# Patient Record
Sex: Female | Born: 1960 | Race: White | Hispanic: No | Marital: Married | Smoking: Never smoker
Health system: Southern US, Community
[De-identification: ages and names within clinical notes are randomized; demographics above are authoritative.]

## PROBLEM LIST (undated history)

## (undated) HISTORY — PX: BREAST EXCISIONAL BIOPSY: SUR124

---

## 1998-08-21 ENCOUNTER — Other Ambulatory Visit: Admission: RE | Admit: 1998-08-21 | Discharge: 1998-08-21 | Payer: Self-pay | Admitting: Unknown Physician Specialty

## 2001-09-28 ENCOUNTER — Other Ambulatory Visit: Admission: RE | Admit: 2001-09-28 | Discharge: 2001-09-28 | Payer: Self-pay | Admitting: Obstetrics and Gynecology

## 2001-10-01 ENCOUNTER — Encounter: Payer: Self-pay | Admitting: Obstetrics and Gynecology

## 2001-10-01 ENCOUNTER — Encounter: Admission: RE | Admit: 2001-10-01 | Discharge: 2001-10-01 | Payer: Self-pay | Admitting: Obstetrics and Gynecology

## 2002-10-04 ENCOUNTER — Encounter: Payer: Self-pay | Admitting: Obstetrics and Gynecology

## 2002-10-04 ENCOUNTER — Encounter: Admission: RE | Admit: 2002-10-04 | Discharge: 2002-10-04 | Payer: Self-pay | Admitting: Obstetrics and Gynecology

## 2003-05-03 ENCOUNTER — Other Ambulatory Visit: Admission: RE | Admit: 2003-05-03 | Discharge: 2003-05-03 | Payer: Self-pay | Admitting: Obstetrics and Gynecology

## 2005-04-24 ENCOUNTER — Encounter: Admission: RE | Admit: 2005-04-24 | Discharge: 2005-04-24 | Payer: Self-pay | Admitting: Obstetrics and Gynecology

## 2005-10-23 ENCOUNTER — Ambulatory Visit: Payer: Self-pay | Admitting: Family Medicine

## 2006-07-01 ENCOUNTER — Encounter: Admission: RE | Admit: 2006-07-01 | Discharge: 2006-07-01 | Payer: Self-pay | Admitting: Family Medicine

## 2006-07-15 ENCOUNTER — Encounter: Admission: RE | Admit: 2006-07-15 | Discharge: 2006-07-15 | Payer: Self-pay | Admitting: Family Medicine

## 2006-08-25 ENCOUNTER — Other Ambulatory Visit: Admission: RE | Admit: 2006-08-25 | Discharge: 2006-08-25 | Payer: Self-pay | Admitting: Obstetrics and Gynecology

## 2007-07-27 ENCOUNTER — Encounter: Admission: RE | Admit: 2007-07-27 | Discharge: 2007-07-27 | Payer: Self-pay | Admitting: Family Medicine

## 2008-07-27 ENCOUNTER — Encounter: Admission: RE | Admit: 2008-07-27 | Discharge: 2008-07-27 | Payer: Self-pay | Admitting: Family Medicine

## 2008-09-06 IMAGING — MG MM SCREEN MAMMOGRAM BILATERAL
1 series · 1 of 1 positions shown · non-contrast
Comparison: none

DG SCREEN MAMMOGRAM BILATERAL
Bilateral CC and MLO view(s) were taken.
Technologist: Prajwal Traylor

DIGITAL SCREENING MAMMOGRAM WITH CAD:
The breast tissue is heterogeneously dense.  No masses or malignant type calcifications are 
identified.  Compared with prior studies.

[L MLO]
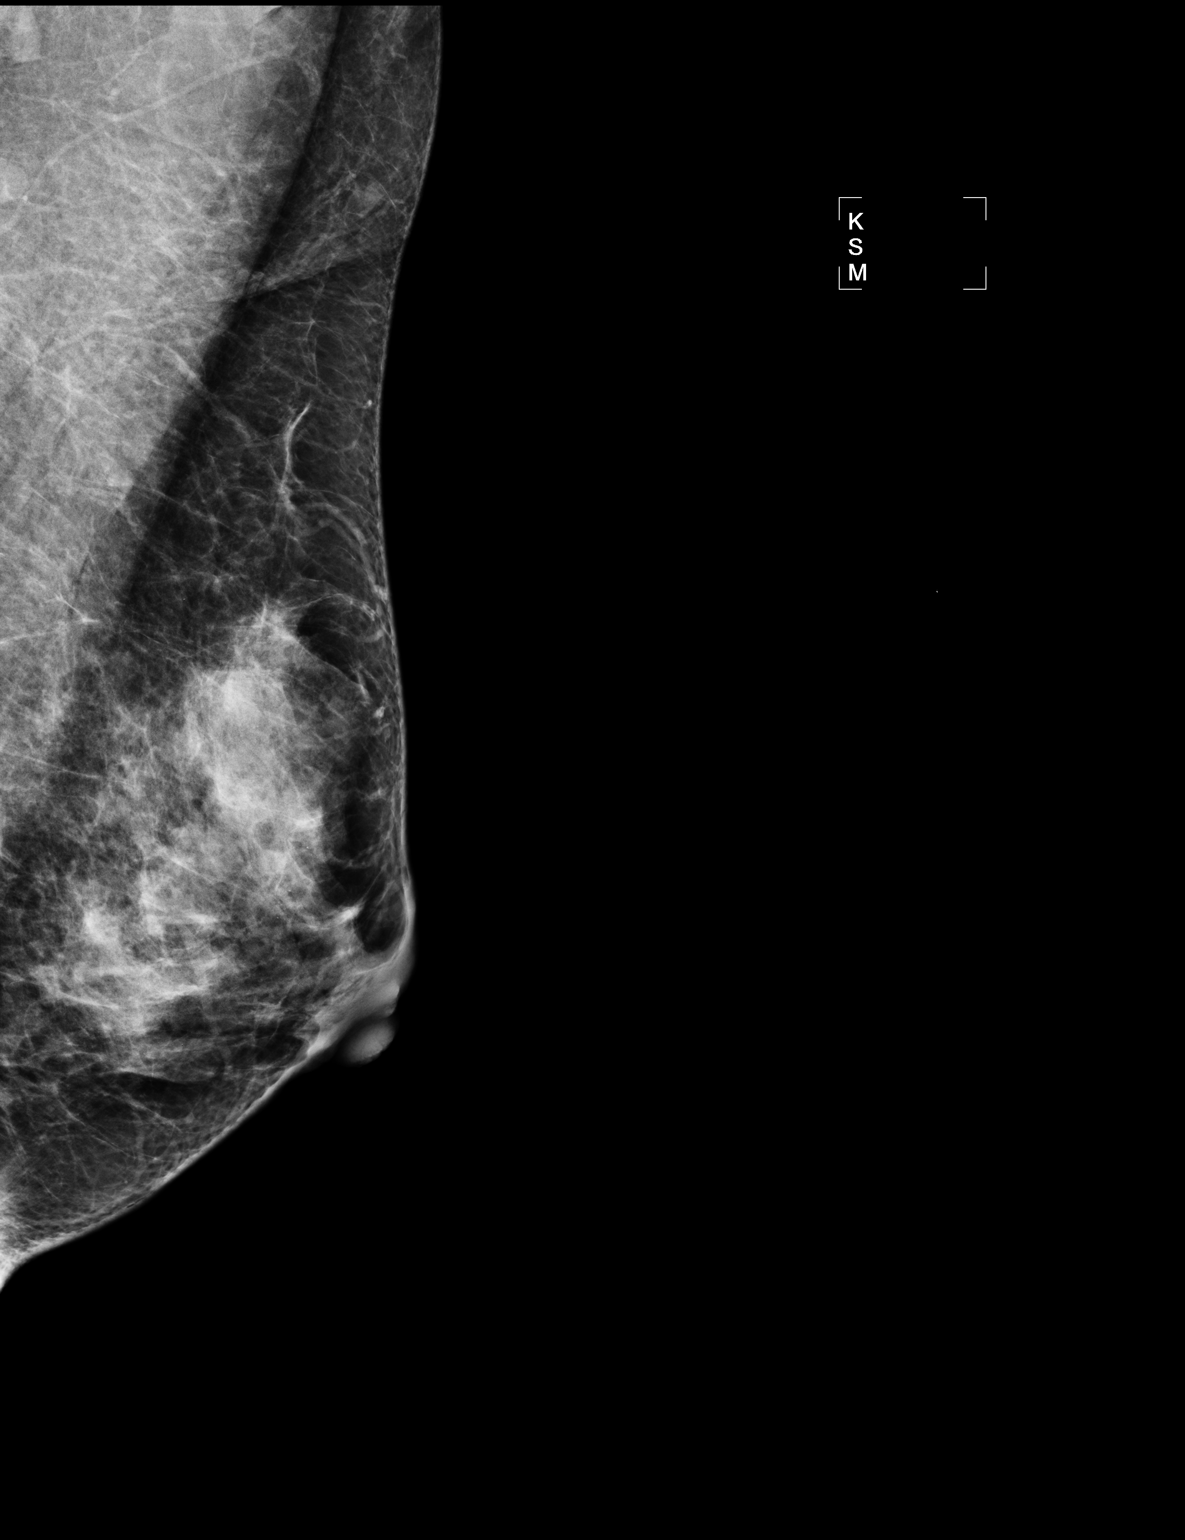

[1 of 1 positions shown; findings below may reference images not displayed]

IMPRESSION: No specific mammographic evidence of malignancy.  Next screening mammogram is recommended in one 
year.

ASSESSMENT: Negative - BI-RADS 1

Screening mammogram in 1 year.
ANALYZED BY COMPUTER AIDED DETECTION. , THIS PROCEDURE WAS A DIGITAL MAMMOGRAM.

## 2009-10-30 ENCOUNTER — Encounter: Admission: RE | Admit: 2009-10-30 | Discharge: 2009-10-30 | Payer: Self-pay | Admitting: Family Medicine

## 2010-10-31 ENCOUNTER — Encounter
Admission: RE | Admit: 2010-10-31 | Discharge: 2010-10-31 | Payer: Self-pay | Source: Home / Self Care | Attending: Family Medicine | Admitting: Family Medicine

## 2010-12-09 ENCOUNTER — Encounter: Payer: Self-pay | Admitting: Family Medicine

## 2011-10-17 ENCOUNTER — Other Ambulatory Visit: Payer: Self-pay | Admitting: Family Medicine

## 2011-10-17 DIAGNOSIS — Z1231 Encounter for screening mammogram for malignant neoplasm of breast: Secondary | ICD-10-CM

## 2011-11-14 ENCOUNTER — Ambulatory Visit: Payer: Self-pay

## 2011-11-28 ENCOUNTER — Ambulatory Visit
Admission: RE | Admit: 2011-11-28 | Discharge: 2011-11-28 | Disposition: A | Discharge status: Home / Self Care | Payer: PRIVATE HEALTH INSURANCE | Source: Ambulatory Visit | Attending: Family Medicine | Admitting: Family Medicine

## 2011-11-28 DIAGNOSIS — Z1231 Encounter for screening mammogram for malignant neoplasm of breast: Secondary | ICD-10-CM

## 2012-10-28 ENCOUNTER — Other Ambulatory Visit: Payer: Self-pay | Admitting: Family Medicine

## 2012-10-28 DIAGNOSIS — Z1231 Encounter for screening mammogram for malignant neoplasm of breast: Secondary | ICD-10-CM

## 2012-12-02 ENCOUNTER — Ambulatory Visit
Admission: RE | Admit: 2012-12-02 | Discharge: 2012-12-02 | Disposition: A | Discharge status: Home / Self Care | Payer: PRIVATE HEALTH INSURANCE | Source: Ambulatory Visit | Attending: Family Medicine | Admitting: Family Medicine

## 2012-12-02 DIAGNOSIS — Z1231 Encounter for screening mammogram for malignant neoplasm of breast: Secondary | ICD-10-CM

## 2013-01-06 DIAGNOSIS — D229 Melanocytic nevi, unspecified: Secondary | ICD-10-CM

## 2013-01-06 DIAGNOSIS — C4491 Basal cell carcinoma of skin, unspecified: Secondary | ICD-10-CM

## 2013-01-06 HISTORY — DX: Basal cell carcinoma of skin, unspecified: C44.91

## 2013-01-06 HISTORY — DX: Melanocytic nevi, unspecified: D22.9

## 2015-03-07 ENCOUNTER — Encounter: Payer: Self-pay | Admitting: *Deleted

## 2015-03-08 ENCOUNTER — Ambulatory Visit: Payer: PRIVATE HEALTH INSURANCE | Admitting: *Deleted

## 2015-03-08 ENCOUNTER — Ambulatory Visit (INDEPENDENT_AMBULATORY_CARE_PROVIDER_SITE_OTHER): Payer: PRIVATE HEALTH INSURANCE | Admitting: *Deleted

## 2015-03-08 DIAGNOSIS — I8393 Asymptomatic varicose veins of bilateral lower extremities: Secondary | ICD-10-CM

## 2015-03-08 NOTE — Progress Notes (Signed)
   Cutaneous Laser:pulsed mode  810j/cm2 400 ms delay  13 ms Duration 0.5 spot  Total pulses: 409    Total energy 646.40  Total time::05  Photos: Yes.    Compression stockings applied: No.NA  Vessels were tiny mostly, so we used the cutaneous laser. Produced appro resp. Anticipate good results. Will follow prn.

## 2016-04-16 ENCOUNTER — Encounter: Payer: Self-pay | Admitting: *Deleted

## 2016-04-24 ENCOUNTER — Ambulatory Visit (INDEPENDENT_AMBULATORY_CARE_PROVIDER_SITE_OTHER): Payer: BLUE CROSS/BLUE SHIELD | Admitting: *Deleted

## 2016-04-24 DIAGNOSIS — I8393 Asymptomatic varicose veins of bilateral lower extremities: Secondary | ICD-10-CM

## 2016-04-24 NOTE — Progress Notes (Signed)
X=.3% Sotradecol administered with a 27g butterfly.  Patient received a total of 6cc.  Treated all areas of concern with one syringe. Easy access. Tol well. Anticipate good results. Follow prn.   Compression stockings applied: Yes.

## 2017-01-08 ENCOUNTER — Encounter: Payer: Self-pay | Admitting: *Deleted

## 2017-01-15 ENCOUNTER — Ambulatory Visit (INDEPENDENT_AMBULATORY_CARE_PROVIDER_SITE_OTHER): Payer: Self-pay | Admitting: *Deleted

## 2017-01-15 DIAGNOSIS — I8393 Asymptomatic varicose veins of bilateral lower extremities: Secondary | ICD-10-CM

## 2017-01-15 NOTE — Progress Notes (Signed)
Treated her tiny red caps. Easy access. Good local rxn. Anticipate good results. Her legs look great! Follow prn.  Cutaneous Laser:pulsed mode  810j/cm2 400 ms delay  13 ms Duration 0.5 spot  Total pulses: 502 Total energy 733  Total time::06

## 2017-01-16 ENCOUNTER — Encounter: Payer: Self-pay | Admitting: *Deleted

## 2017-06-17 ENCOUNTER — Other Ambulatory Visit: Payer: Self-pay | Admitting: Obstetrics and Gynecology

## 2017-06-17 DIAGNOSIS — E2839 Other primary ovarian failure: Secondary | ICD-10-CM

## 2017-06-19 ENCOUNTER — Other Ambulatory Visit: Payer: Self-pay | Admitting: Obstetrics and Gynecology

## 2017-06-19 DIAGNOSIS — R928 Other abnormal and inconclusive findings on diagnostic imaging of breast: Secondary | ICD-10-CM

## 2017-06-27 ENCOUNTER — Ambulatory Visit
Admission: RE | Admit: 2017-06-27 | Discharge: 2017-06-27 | Disposition: A | Discharge status: Home / Self Care | Payer: BLUE CROSS/BLUE SHIELD | Source: Ambulatory Visit | Attending: Obstetrics and Gynecology | Admitting: Obstetrics and Gynecology

## 2017-06-27 ENCOUNTER — Ambulatory Visit: Payer: BLUE CROSS/BLUE SHIELD

## 2017-06-27 DIAGNOSIS — E2839 Other primary ovarian failure: Secondary | ICD-10-CM

## 2017-06-27 DIAGNOSIS — R928 Other abnormal and inconclusive findings on diagnostic imaging of breast: Secondary | ICD-10-CM

## 2020-03-13 ENCOUNTER — Encounter: Payer: Self-pay | Admitting: *Deleted

## 2020-03-14 ENCOUNTER — Ambulatory Visit: Payer: BLUE CROSS/BLUE SHIELD | Admitting: Physician Assistant

## 2021-03-29 ENCOUNTER — Ambulatory Visit: Payer: Self-pay | Admitting: Physician Assistant

## 2021-04-12 ENCOUNTER — Encounter: Payer: Self-pay | Admitting: Physician Assistant

## 2021-04-12 ENCOUNTER — Ambulatory Visit (INDEPENDENT_AMBULATORY_CARE_PROVIDER_SITE_OTHER): Payer: 59 | Admitting: Physician Assistant

## 2021-04-12 ENCOUNTER — Other Ambulatory Visit: Payer: Self-pay

## 2021-04-12 DIAGNOSIS — Z85828 Personal history of other malignant neoplasm of skin: Secondary | ICD-10-CM | POA: Diagnosis not present

## 2021-04-12 DIAGNOSIS — L814 Other melanin hyperpigmentation: Secondary | ICD-10-CM | POA: Diagnosis not present

## 2021-04-12 DIAGNOSIS — Z1283 Encounter for screening for malignant neoplasm of skin: Secondary | ICD-10-CM

## 2021-04-12 DIAGNOSIS — L821 Other seborrheic keratosis: Secondary | ICD-10-CM

## 2021-04-12 DIAGNOSIS — D485 Neoplasm of uncertain behavior of skin: Secondary | ICD-10-CM

## 2021-04-12 DIAGNOSIS — Z808 Family history of malignant neoplasm of other organs or systems: Secondary | ICD-10-CM

## 2021-04-12 DIAGNOSIS — D1800 Hemangioma unspecified site: Secondary | ICD-10-CM

## 2021-04-12 DIAGNOSIS — L578 Other skin changes due to chronic exposure to nonionizing radiation: Secondary | ICD-10-CM

## 2021-04-12 DIAGNOSIS — L570 Actinic keratosis: Secondary | ICD-10-CM

## 2021-04-12 DIAGNOSIS — Z86018 Personal history of other benign neoplasm: Secondary | ICD-10-CM | POA: Diagnosis not present

## 2021-04-12 NOTE — Patient Instructions (Signed)

## 2021-04-12 NOTE — Progress Notes (Signed)
   Follow-Up Visit   Subjective  Anne Alvarado is a 60 y.o. female who presents for the following: Annual Exam (No new concerns.Per records reviewed personal history of non mole skin cancers & multiple atypical nevi. Patient states she does have family history of melanoma.  ).   The following portions of the chart were reviewed this encounter and updated as appropriate:  Tobacco  Allergies  Meds  Problems  Med Hx  Surg Hx  Fam Hx      Objective  Well appearing patient in no apparent distress; mood and affect are within normal limits.  A full examination was performed including scalp, head, eyes, ears, nose, lips, neck, chest, axillae, abdomen, back, buttocks, bilateral upper extremities, bilateral lower extremities, hands, feet, fingers, toes, fingernails, and toenails. All findings within normal limits unless otherwise noted below.  Objective  Dorsum of Nose: Pink macule with pearly tone. Positive for blanching.      Assessment & Plan  Neoplasm of uncertain behavior of skin Dorsum of Nose  Skin / nail biopsy Type of biopsy: tangential   Informed consent: discussed and consent obtained   Timeout: patient name, date of birth, surgical site, and procedure verified   Procedure prep:  Patient was prepped and draped in usual sterile fashion (Non sterile) Prep type:  Chlorhexidine Anesthesia: the lesion was anesthetized in a standard fashion   Anesthetic:  1% lidocaine w/ epinephrine 1-100,000 local infiltration Instrument used: flexible razor blade   Outcome: patient tolerated procedure well   Post-procedure details: wound care instructions given    Specimen 1 - Surgical pathology Differential Diagnosis: bcc vs scc  Check Margins: No  Lentigines - Scattered tan macules - Discussed due to sun exposure - Benign, observe - Call for any changes  Seborrheic Keratoses - Stuck-on, waxy, tan-brown papules and plaques  - Discussed benign etiology and prognosis. -  Observe - Call for any changes  Hemangiomas - Red papules - Discussed benign nature - Observe - Call for any changes  Actinic Damage - diffuse scaly erythematous macules with underlying dyspigmentation - Recommend daily broad spectrum sunscreen SPF 30+ to sun-exposed areas, reapply every 2 hours as needed.  - Call for new or changing lesions.  Skin cancer screening performed today. No atypical nevi.    I, Taitum Alms, PA-C, have reviewed all documentation's for this visit.  The documentation on 04/12/21 for the exam, diagnosis, procedures and orders are all accurate and complete.

## 2021-04-25 ENCOUNTER — Telehealth: Payer: Self-pay | Admitting: *Deleted

## 2021-04-25 NOTE — Telephone Encounter (Signed)
-----   Message from Warren Danes, [place] sent at 04/25/2021  8:01 AM EDT ----- RTC if recur

## 2021-04-25 NOTE — Telephone Encounter (Signed)
Pathology to patient.  °

## 2021-12-01 ENCOUNTER — Other Ambulatory Visit: Payer: Self-pay | Admitting: Obstetrics and Gynecology

## 2021-12-01 DIAGNOSIS — R928 Other abnormal and inconclusive findings on diagnostic imaging of breast: Secondary | ICD-10-CM

## 2021-12-13 ENCOUNTER — Other Ambulatory Visit: Payer: Self-pay

## 2021-12-13 ENCOUNTER — Ambulatory Visit
Admission: RE | Admit: 2021-12-13 | Discharge: 2021-12-13 | Disposition: A | Discharge status: Home / Self Care | Payer: 59 | Source: Ambulatory Visit | Attending: Obstetrics and Gynecology | Admitting: Obstetrics and Gynecology

## 2021-12-13 ENCOUNTER — Ambulatory Visit: Payer: 59

## 2021-12-13 DIAGNOSIS — R928 Other abnormal and inconclusive findings on diagnostic imaging of breast: Secondary | ICD-10-CM

## 2022-01-07 ENCOUNTER — Other Ambulatory Visit: Payer: 59

## 2022-04-02 ENCOUNTER — Other Ambulatory Visit: Payer: Self-pay | Admitting: Physician Assistant

## 2022-04-02 ENCOUNTER — Ambulatory Visit (INDEPENDENT_AMBULATORY_CARE_PROVIDER_SITE_OTHER): Payer: 59 | Admitting: Physician Assistant

## 2022-04-02 ENCOUNTER — Encounter: Payer: Self-pay | Admitting: Physician Assistant

## 2022-04-02 DIAGNOSIS — D485 Neoplasm of uncertain behavior of skin: Secondary | ICD-10-CM

## 2022-04-02 DIAGNOSIS — Z85828 Personal history of other malignant neoplasm of skin: Secondary | ICD-10-CM | POA: Diagnosis not present

## 2022-04-02 DIAGNOSIS — D229 Melanocytic nevi, unspecified: Secondary | ICD-10-CM

## 2022-04-02 DIAGNOSIS — Z86018 Personal history of other benign neoplasm: Secondary | ICD-10-CM | POA: Diagnosis not present

## 2022-04-02 DIAGNOSIS — D225 Melanocytic nevi of trunk: Secondary | ICD-10-CM | POA: Diagnosis not present

## 2022-04-02 DIAGNOSIS — Z1283 Encounter for screening for malignant neoplasm of skin: Secondary | ICD-10-CM

## 2022-04-02 HISTORY — DX: Melanocytic nevi, unspecified: D22.9

## 2022-04-02 NOTE — Patient Instructions (Signed)

## 2022-04-02 NOTE — Progress Notes (Signed)
? ?  Follow-Up Visit ?  ?Subjective  ?Anne Alvarado is a 61 y.o. female who presents for the following: Annual Exam (Here for annual skin exam. No concerns. History of non mole skin cancers. History of atypical moles. ). ? ? ?The following portions of the chart were reviewed this encounter and updated as appropriate:  Tobacco  Allergies  Meds  Problems  Med Hx  Surg Hx  Fam Hx   ?  ? ?Objective  ?Well appearing patient in no apparent distress; mood and affect are within normal limits. ? ?A full examination was performed including scalp, head, eyes, ears, nose, lips, neck, chest, axillae, abdomen, back, buttocks, bilateral upper extremities, bilateral lower extremities, hands, feet, fingers, toes, fingernails, and toenails. All findings within normal limits unless otherwise noted below. ? ?No signs of non-mole skin cancer.  ? ?Right Abdomen (side) - Lower ?Bichromic dark nested macule.  ? ? ? ? ? ? ? ?Assessment & Plan  ?Screening for malignant neoplasm of skin ? ?Yearly skin exam ? ?Neoplasm of uncertain behavior of skin ?Right Abdomen (side) - Lower ? ?Skin / nail biopsy ?Type of biopsy: tangential   ?Informed consent: discussed and consent obtained   ?Timeout: patient name, date of birth, surgical site, and procedure verified   ?Anesthesia: the lesion was anesthetized in a standard fashion   ?Anesthetic:  1% lidocaine w/ epinephrine 1-100,000 local infiltration ?Instrument used: flexible razor blade   ?Hemostasis achieved with: ferric subsulfate   ?Outcome: patient tolerated procedure well   ?Post-procedure details: wound care instructions given   ? ?Specimen 1 - Surgical pathology ?Differential Diagnosis: atypia  ? ?Check Margins: YES ? ? ? ?I, Anne Bellard, PA-C, have reviewed all documentation's for this visit.  The documentation on 04/02/22 for the exam, diagnosis, procedures and orders are all accurate and complete. ?

## 2022-04-08 ENCOUNTER — Telehealth: Payer: Self-pay

## 2022-04-08 NOTE — Telephone Encounter (Signed)
-----   Message from Warren Danes, [place] sent at 04/08/2022 11:13 AM EDT ----- ws

## 2022-04-08 NOTE — Telephone Encounter (Signed)
Phone call to patient with her pathology results. Patient aware of results.  

## 2022-04-16 ENCOUNTER — Ambulatory Visit: Payer: 59 | Admitting: Physician Assistant

## 2022-05-08 ENCOUNTER — Ambulatory Visit (INDEPENDENT_AMBULATORY_CARE_PROVIDER_SITE_OTHER): Payer: 59 | Admitting: Physician Assistant

## 2022-05-08 ENCOUNTER — Encounter: Payer: Self-pay | Admitting: Physician Assistant

## 2022-05-08 DIAGNOSIS — D229 Melanocytic nevi, unspecified: Secondary | ICD-10-CM

## 2022-05-08 DIAGNOSIS — D225 Melanocytic nevi of trunk: Secondary | ICD-10-CM | POA: Diagnosis not present

## 2022-05-08 NOTE — Patient Instructions (Signed)

## 2022-05-08 NOTE — Progress Notes (Signed)
   Follow-Up Visit   Subjective  Anne Alvarado is a 61 y.o. female who presents for the following: Procedure (Wider shave right abdomen severe atypia). Healed well.    The following portions of the chart were reviewed this encounter and updated as appropriate:  Tobacco  Allergies  Meds  Problems  Med Hx  Surg Hx  Fam Hx      Objective  Well appearing patient in no apparent distress; mood and affect are within normal limits.  A focused examination was performed including abdomen. Relevant physical exam findings are noted in the Assessment and Plan.  Right Abdomen (side) - Lower Pink macule   Assessment & Plan  Atypical mole Right Abdomen (side) - Lower  Epidermal / dermal shaving - Right Abdomen (side) - Lower  Lesion diameter (cm):  1.3 Informed consent: discussed and consent obtained   Timeout: patient name, date of birth, surgical site, and procedure verified   Procedure prep:  Patient was prepped and draped in usual sterile fashion Prep type:  Chlorhexidine Anesthesia: the lesion was anesthetized in a standard fashion   Anesthetic:  1% lidocaine w/ epinephrine 1-100,000 local infiltration Instrument used: DermaBlade   Hemostasis achieved with: aluminum chloride and electrodesiccation   Outcome: patient tolerated procedure well   Post-procedure details: sterile dressing applied and wound care instructions given   Dressing type: petrolatum and bandage    Specimen 1 - Surgical pathology Differential Diagnosis: R/O Atypia - cautery after biopsy  [id number]  Check Margins: yes    I, Jacalynn Buzzell, PA-C, have reviewed all documentation's for this visit.  The documentation on 05/08/22 for the exam, diagnosis, procedures and orders are all accurate and complete.

## 2022-05-14 ENCOUNTER — Telehealth: Payer: Self-pay

## 2022-11-06 ENCOUNTER — Ambulatory Visit: Payer: 59 | Admitting: Physician Assistant

## 2022-11-06 ENCOUNTER — Ambulatory Visit (INDEPENDENT_AMBULATORY_CARE_PROVIDER_SITE_OTHER): Payer: 59 | Admitting: Family Medicine

## 2022-11-06 VITALS — BP 130/70 | HR 76 | Temp 98.2°F | Ht 65.75 in | Wt 141.0 lb

## 2022-11-06 DIAGNOSIS — Z0000 Encounter for general adult medical examination without abnormal findings: Secondary | ICD-10-CM | POA: Diagnosis not present

## 2022-11-06 DIAGNOSIS — Z13228 Encounter for screening for other metabolic disorders: Secondary | ICD-10-CM

## 2022-11-06 DIAGNOSIS — Z130 Encounter for screening for diseases of the blood and blood-forming organs and certain disorders involving the immune mechanism: Secondary | ICD-10-CM | POA: Diagnosis not present

## 2022-11-06 DIAGNOSIS — Z13220 Encounter for screening for lipoid disorders: Secondary | ICD-10-CM

## 2022-11-06 DIAGNOSIS — F419 Anxiety disorder, unspecified: Secondary | ICD-10-CM | POA: Insufficient documentation

## 2022-11-06 NOTE — Progress Notes (Signed)
Subjective:  Patient ID: Anne Alvarado, female    DOB: 09-01-1961  Age: 61 y.o. MRN: [id number]  CC: Chief Complaint  Patient presents with   Establish Care    HPI:  61 year old female presents to establish care.  Patient states that she is doing well.  Patient has not been seen by primary care provider since 2020.  She has no specific complaints or concerns at this time.  Patient declines influenza vaccine.  Mammogram up-to-date.  Patient reports that her last colonoscopy was 3 years ago.  She is due at the age of 54.  This was done via [facility].  Need records.  She is followed by OB/GYN, Dr. Marvel Plan.  She states that her Pap smear is up-to-date as well.  Need records.  Discussed getting shingles vaccine.  She will consider getting this at her pharmacy.  Unsure of last tetanus.  Need records.  Social Hx   Social History   Socioeconomic History   Marital status: Married    Spouse name: Not on file   Number of children: Not on file   Years of education: Not on file   Highest education level: Not on file  Occupational History   Not on file  Tobacco Use   Smoking status: Never   Smokeless tobacco: Never  Vaping Use   Vaping Use: Never used  Substance and Sexual Activity   Alcohol use: Yes   Drug use: Never   Sexual activity: Not on file  Other Topics Concern   Not on file  Social History Narrative   Not on file   Social Determinants of Health   Financial Resource Strain: Not on file  Food Insecurity: Not on file  Transportation Needs: Not on file  Physical Activity: Not on file  Stress: Not on file  Social Connections: Not on file    Review of Systems  Constitutional: Negative.   Respiratory: Negative.    Cardiovascular: Negative.   Gastrointestinal: Negative.    Objective:  BP 130/70   Pulse 76   Temp 98.2 F (36.8 C) (Oral)   Ht 5' 5.75" (1.67 m)   Wt 141 lb (64 kg)   SpO2 98%   BMI 22.93 kg/m      11/06/2022   10:27 AM   BP/Weight  Systolic BP 144  Diastolic BP 70  Wt. (Lbs) 141  BMI 22.93 kg/m2    Physical Exam Vitals and nursing note reviewed.  Constitutional:      General: She is not in acute distress.    Appearance: Normal appearance.  HENT:     Head: Normocephalic and atraumatic.  Eyes:     General:        Right eye: No discharge.        Left eye: No discharge.     Conjunctiva/sclera: Conjunctivae normal.  Cardiovascular:     Rate and Rhythm: Normal rate and regular rhythm.  Pulmonary:     Effort: Pulmonary effort is normal.     Breath sounds: Normal breath sounds. No wheezing, rhonchi or rales.  Neurological:     Mental Status: She is alert.  Psychiatric:        Mood and Affect: Mood normal.        Behavior: Behavior normal.    Assessment & Plan:   Problem List Items Addressed This Visit       Other   Annual physical exam - Primary    Screening labs ordered. Colonoscopy up-to-date as well as Pap  smear per patient report.  Need records. Unsure of last tetanus. Declines flu vaccine today. Recommended getting shingles vaccine at her pharmacy.      Other Visit Diagnoses     Screening for deficiency anemia       Relevant Orders   CBC   Screening for lipid disorders       Relevant Orders   Lipid panel   Screening for metabolic disorder       Relevant Orders   CMP14+EGFR      Follow-up:  Return in about 1 year (around 11/07/2023).  [place]

## 2022-11-06 NOTE — Patient Instructions (Signed)
You can do your labs whenever you like.  Follow up annually.  Take care  Dr. Lacinda Axon

## 2022-11-06 NOTE — Assessment & Plan Note (Signed)
Screening labs ordered. Colonoscopy up-to-date as well as Pap smear per patient report.  Need records. Unsure of last tetanus. Declines flu vaccine today. Recommended getting shingles vaccine at her pharmacy.

## 2023-02-04 ENCOUNTER — Other Ambulatory Visit: Payer: Self-pay | Admitting: Obstetrics and Gynecology

## 2023-02-04 DIAGNOSIS — Z0000 Encounter for general adult medical examination without abnormal findings: Secondary | ICD-10-CM

## 2023-02-06 ENCOUNTER — Ambulatory Visit
Admission: RE | Admit: 2023-02-06 | Discharge: 2023-02-06 | Disposition: A | Discharge status: Home / Self Care | Payer: 59 | Source: Ambulatory Visit | Attending: Obstetrics and Gynecology | Admitting: Obstetrics and Gynecology

## 2023-02-06 DIAGNOSIS — Z1231 Encounter for screening mammogram for malignant neoplasm of breast: Secondary | ICD-10-CM | POA: Diagnosis not present

## 2023-02-06 DIAGNOSIS — Z0000 Encounter for general adult medical examination without abnormal findings: Secondary | ICD-10-CM

## 2023-03-13 DIAGNOSIS — G4700 Insomnia, unspecified: Secondary | ICD-10-CM | POA: Diagnosis not present

## 2023-03-13 DIAGNOSIS — Z0142 Encounter for cervical smear to confirm findings of recent normal smear following initial abnormal smear: Secondary | ICD-10-CM | POA: Diagnosis not present

## 2023-03-13 DIAGNOSIS — N898 Other specified noninflammatory disorders of vagina: Secondary | ICD-10-CM | POA: Diagnosis not present

## 2023-03-13 DIAGNOSIS — Z124 Encounter for screening for malignant neoplasm of cervix: Secondary | ICD-10-CM | POA: Diagnosis not present

## 2023-03-13 DIAGNOSIS — Z01411 Encounter for gynecological examination (general) (routine) with abnormal findings: Secondary | ICD-10-CM | POA: Diagnosis not present

## 2023-03-13 DIAGNOSIS — Z1151 Encounter for screening for human papillomavirus (HPV): Secondary | ICD-10-CM | POA: Diagnosis not present

## 2023-03-13 DIAGNOSIS — Z130 Encounter for screening for diseases of the blood and blood-forming organs and certain disorders involving the immune mechanism: Secondary | ICD-10-CM | POA: Diagnosis not present

## 2023-03-13 DIAGNOSIS — Z1389 Encounter for screening for other disorder: Secondary | ICD-10-CM | POA: Diagnosis not present

## 2023-03-13 DIAGNOSIS — Z1272 Encounter for screening for malignant neoplasm of vagina: Secondary | ICD-10-CM | POA: Diagnosis not present

## 2023-04-03 ENCOUNTER — Ambulatory Visit: Payer: 59 | Admitting: Physician Assistant

## 2023-04-10 DIAGNOSIS — Z6822 Body mass index (BMI) 22.0-22.9, adult: Secondary | ICD-10-CM | POA: Diagnosis not present

## 2023-04-10 DIAGNOSIS — J020 Streptococcal pharyngitis: Secondary | ICD-10-CM | POA: Diagnosis not present

## 2023-05-08 ENCOUNTER — Encounter: Payer: Self-pay | Admitting: Family Medicine

## 2023-05-08 ENCOUNTER — Ambulatory Visit: Payer: 59 | Admitting: Family Medicine

## 2023-05-08 VITALS — BP 158/70 | HR 59 | Temp 97.9°F | Ht 65.75 in | Wt 140.0 lb

## 2023-05-08 DIAGNOSIS — Z130 Encounter for screening for diseases of the blood and blood-forming organs and certain disorders involving the immune mechanism: Secondary | ICD-10-CM

## 2023-05-08 DIAGNOSIS — Z13220 Encounter for screening for lipoid disorders: Secondary | ICD-10-CM | POA: Diagnosis not present

## 2023-05-08 DIAGNOSIS — R030 Elevated blood-pressure reading, without diagnosis of hypertension: Secondary | ICD-10-CM | POA: Diagnosis not present

## 2023-05-08 NOTE — Progress Notes (Signed)
Subjective:  Patient ID: Anne Alvarado, female    DOB: [birth year]  Age: 62 y.o. MRN: [id number]  CC: Chief Complaint  Patient presents with   6 month follow up    No concerns/Patient reports has white coat syndrome    HPI:  62 year old female presents for follow-up.  Blood pressure elevated here today.  Patient states that her blood pressure is often elevated when she is at a physician's office.  She states that her home readings are in the 120 systolic.  Pap smear and colonoscopy up-to-date.  Patient states she is feeling well.  No chest pain or shortness of breath.  No vision issues.  No hearing difficulties.  She states that she is doing well.  Patient Active Problem List   Diagnosis Date Noted   Elevated BP without diagnosis of hypertension 05/08/2023   Anxiety 11/06/2022   Annual physical exam 11/06/2022    Social Hx   Social History   Socioeconomic History   Marital status: Married    Spouse name: Not on file   Number of children: Not on file   Years of education: Not on file   Highest education level: 12th grade  Occupational History   Not on file  Tobacco Use   Smoking status: Never   Smokeless tobacco: Never  Vaping Use   Vaping Use: Never used  Substance and Sexual Activity   Alcohol use: Yes   Drug use: Never   Sexual activity: Not on file  Other Topics Concern   Not on file  Social History Narrative   Not on file   Social Determinants of Health   Financial Resource Strain: Low Risk  (05/05/2023)   Overall Financial Resource Strain (CARDIA)    Difficulty of Paying Living Expenses: Not hard at all  Food Insecurity: No Food Insecurity (05/05/2023)   Hunger Vital Sign    Worried About Running Out of Food in the Last Year: Never true    Ran Out of Food in the Last Year: Never true  Transportation Needs: No Transportation Needs (05/05/2023)   PRAPARE - Administrator, Civil Service (Medical): No    Lack of Transportation  (Non-Medical): No  Physical Activity: Insufficiently Active (05/05/2023)   Exercise Vital Sign    Days of Exercise per Week: 4 days    Minutes of Exercise per Session: 20 min  Stress: No Stress Concern Present (05/05/2023)   [employer] of Occupational Health - Occupational Stress Questionnaire    Feeling of Stress : Only a little  Social Connections: Moderately Integrated (05/05/2023)   Social Connection and Isolation Panel [NHANES]    Frequency of Communication with Friends and Family: More than three times a week    Frequency of Social Gatherings with Friends and Family: More than three times a week    Attends Religious Services: More than 4 times per year    Active Member of [employer] or Organizations: No    Attends Engineer, structural: Not on file    Marital Status: Married    Review of Systems Per HPI  Objective:  BP (!) 158/70   Pulse (!) 59   Temp 97.9 F (36.6 C)   Ht 5' 5.75" (1.67 m)   Wt 140 lb (63.5 kg)   BMI 22.77 kg/m      05/08/2023   10:17 AM 05/08/2023    9:41 AM 11/06/2022   10:27 AM  BP/Weight  Systolic BP 158 192 130  Diastolic BP 70  67 70  Wt. (Lbs)  140 141  BMI  22.77 kg/m2 22.93 kg/m2    Physical Exam Vitals and nursing note reviewed.  Constitutional:      General: She is not in acute distress.    Appearance: Normal appearance.  HENT:     Head: Normocephalic and atraumatic.  Eyes:     General:        Right eye: No discharge.        Left eye: No discharge.     Conjunctiva/sclera: Conjunctivae normal.  Cardiovascular:     Rate and Rhythm: Normal rate and regular rhythm.  Pulmonary:     Effort: Pulmonary effort is normal.     Breath sounds: Normal breath sounds. No wheezing, rhonchi or rales.  Neurological:     Mental Status: She is alert.  Psychiatric:        Mood and Affect: Mood normal.        Behavior: Behavior normal.     No results found for: "WBC", "HGB", "HCT", "PLT", "GLUCOSE", "CHOL", "TRIG", "HDL",  "LDLDIRECT", "LDLCALC", "ALT", "AST", "NA", "K", "CL", "CREATININE", "BUN", "CO2", "TSH", "PSA", "INR", "GLUF", "HGBA1C", "MICROALBUR"   Assessment & Plan:   Problem List Items Addressed This Visit       Other   Elevated BP without diagnosis of hypertension - Primary    Patient's blood pressure elevated but normal at home.  Will continue to monitor.  Patient was supposed to get labs at her last visit.  Will order labs today.  Supportive care.      Relevant Orders   CMP14+EGFR   Other Visit Diagnoses     Screening for deficiency anemia       Relevant Orders   CBC   Screening for lipid disorders       Relevant Orders   Lipid panel       Follow-up:  Return in about 1 year (around 05/07/2024).  Everlene Other DO [facility]

## 2023-05-08 NOTE — Patient Instructions (Signed)
Labs today. ° °Follow up annually. ° °Take care ° °Dr. Leylah Tarnow  °

## 2023-05-08 NOTE — Assessment & Plan Note (Signed)
Patient's blood pressure elevated but normal at home.  Will continue to monitor.  Patient was supposed to get labs at her last visit.  Will order labs today.  Supportive care.

## 2023-05-09 LAB — CBC
Hematocrit: 40 % (ref 34.0–46.6)
Hemoglobin: 13.5 g/dL (ref 11.1–15.9)
MCH: 31.2 pg (ref 26.6–33.0)
MCHC: 33.8 g/dL (ref 31.5–35.7)
MCV: 92 fL (ref 79–97)
Platelets: 225 10*3/uL (ref 150–450)
RBC: 4.33 x10E6/uL (ref 3.77–5.28)
RDW: 12.2 % (ref 11.7–15.4)
WBC: 5.5 10*3/uL (ref 3.4–10.8)

## 2023-05-09 LAB — CMP14+EGFR
ALT: 21 IU/L (ref 0–32)
AST: 22 IU/L (ref 0–40)
Albumin: 4.7 g/dL (ref 3.9–4.9)
Alkaline Phosphatase: 108 IU/L (ref 44–121)
BUN/Creatinine Ratio: 19 (ref 12–28)
BUN: 16 mg/dL (ref 8–27)
Bilirubin Total: 0.5 mg/dL (ref 0.0–1.2)
CO2: 23 mmol/L (ref 20–29)
Calcium: 10 mg/dL (ref 8.7–10.3)
Chloride: 101 mmol/L (ref 96–106)
Creatinine, Ser: 0.83 mg/dL (ref 0.57–1.00)
Globulin, Total: 2.8 g/dL (ref 1.5–4.5)
Glucose: 89 mg/dL (ref 70–99)
Potassium: 4.2 mmol/L (ref 3.5–5.2)
Sodium: 140 mmol/L (ref 134–144)
Total Protein: 7.5 g/dL (ref 6.0–8.5)
eGFR: 79 mL/min/{1.73_m2} (ref >59)

## 2023-05-09 LAB — LIPID PANEL
Chol/HDL Ratio: 2.1 ratio (ref 0.0–4.4)
Cholesterol, Total: 187 mg/dL (ref 100–199)
HDL: 88 mg/dL (ref >39)
LDL Chol Calc (NIH): 78 mg/dL (ref 0–99)
Triglycerides: 126 mg/dL (ref 0–149)
VLDL Cholesterol Cal: 21 mg/dL (ref 5–40)

## 2024-03-01 ENCOUNTER — Other Ambulatory Visit: Payer: Self-pay | Admitting: Obstetrics and Gynecology

## 2024-03-01 DIAGNOSIS — Z0000 Encounter for general adult medical examination without abnormal findings: Secondary | ICD-10-CM

## 2024-03-03 ENCOUNTER — Ambulatory Visit
Admission: RE | Admit: 2024-03-03 | Discharge: 2024-03-03 | Disposition: A | Discharge status: Home / Self Care | Source: Ambulatory Visit | Attending: Obstetrics and Gynecology | Admitting: Obstetrics and Gynecology

## 2024-03-03 DIAGNOSIS — Z1231 Encounter for screening mammogram for malignant neoplasm of breast: Secondary | ICD-10-CM | POA: Diagnosis not present

## 2024-03-03 DIAGNOSIS — Z0000 Encounter for general adult medical examination without abnormal findings: Secondary | ICD-10-CM

## 2024-04-01 DIAGNOSIS — N952 Postmenopausal atrophic vaginitis: Secondary | ICD-10-CM | POA: Diagnosis not present

## 2024-04-01 DIAGNOSIS — Z01419 Encounter for gynecological examination (general) (routine) without abnormal findings: Secondary | ICD-10-CM | POA: Diagnosis not present

## 2024-04-01 DIAGNOSIS — Z1389 Encounter for screening for other disorder: Secondary | ICD-10-CM | POA: Diagnosis not present

## 2024-12-27 ENCOUNTER — Encounter: Admitting: Family Medicine
